# Patient Record
Sex: Female | Born: 1995 | Race: White | Hispanic: No | Marital: Single | Smoking: Current every day smoker
Health system: Southern US, Community
[De-identification: ages and names within clinical notes are randomized; demographics above are authoritative.]

## PROBLEM LIST (undated history)

## (undated) DIAGNOSIS — B2790 Infectious mononucleosis, unspecified without complication: Secondary | ICD-10-CM

## (undated) DIAGNOSIS — E669 Obesity, unspecified: Secondary | ICD-10-CM

## (undated) DIAGNOSIS — F99 Mental disorder, not otherwise specified: Secondary | ICD-10-CM

## (undated) DIAGNOSIS — J45909 Unspecified asthma, uncomplicated: Secondary | ICD-10-CM

## (undated) HISTORY — PX: NO PAST SURGERIES: SHX2092

## (undated) HISTORY — DX: Mental disorder, not otherwise specified: F99

---
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
## 2016-09-27 ENCOUNTER — Emergency Department (HOSPITAL_BASED_OUTPATIENT_CLINIC_OR_DEPARTMENT_OTHER): Payer: BLUE CROSS/BLUE SHIELD

## 2016-09-27 ENCOUNTER — Emergency Department (HOSPITAL_BASED_OUTPATIENT_CLINIC_OR_DEPARTMENT_OTHER)
Admission: EM | Admit: 2016-09-27 | Discharge: 2016-09-28 | Disposition: A | Discharge status: Home / Self Care | Payer: BLUE CROSS/BLUE SHIELD | Attending: Emergency Medicine | Admitting: Emergency Medicine

## 2016-09-27 ENCOUNTER — Encounter (HOSPITAL_BASED_OUTPATIENT_CLINIC_OR_DEPARTMENT_OTHER): Payer: Self-pay

## 2016-09-27 DIAGNOSIS — N12 Tubulo-interstitial nephritis, not specified as acute or chronic: Secondary | ICD-10-CM | POA: Insufficient documentation

## 2016-09-27 DIAGNOSIS — J45909 Unspecified asthma, uncomplicated: Secondary | ICD-10-CM | POA: Insufficient documentation

## 2016-09-27 DIAGNOSIS — J209 Acute bronchitis, unspecified: Secondary | ICD-10-CM | POA: Insufficient documentation

## 2016-09-27 DIAGNOSIS — R109 Unspecified abdominal pain: Secondary | ICD-10-CM | POA: Diagnosis present

## 2016-09-27 LAB — URINALYSIS, ROUTINE W REFLEX MICROSCOPIC
BILIRUBIN URINE: NEGATIVE
GLUCOSE, UA: NEGATIVE mg/dL
Ketones, ur: NEGATIVE mg/dL
Nitrite: NEGATIVE
SPECIFIC GRAVITY, URINE: 1.019 (ref 1.005–1.030)
pH: 6.5 (ref 5.0–8.0)

## 2016-09-27 LAB — CBC WITH DIFFERENTIAL/PLATELET
BASOS ABS: 0 10*3/uL (ref 0.0–0.1)
BASOS PCT: 0 %
Eosinophils Absolute: 0.6 10*3/uL (ref 0.0–0.7)
Eosinophils Relative: 5 %
HEMATOCRIT: 34.3 % — AB (ref 36.0–46.0)
HEMOGLOBIN: 11 g/dL — AB (ref 12.0–15.0)
LYMPHS PCT: 29 %
Lymphs Abs: 3.4 10*3/uL (ref 0.7–4.0)
MCH: 24.7 pg — ABNORMAL LOW (ref 26.0–34.0)
MCHC: 32.1 g/dL (ref 30.0–36.0)
MCV: 76.9 fL — AB (ref 78.0–100.0)
Monocytes Absolute: 0.9 10*3/uL (ref 0.1–1.0)
Monocytes Relative: 8 %
NEUTROS ABS: 6.7 10*3/uL (ref 1.7–7.7)
NEUTROS PCT: 58 %
Platelets: 294 10*3/uL (ref 150–400)
RBC: 4.46 MIL/uL (ref 3.87–5.11)
RDW: 15 % (ref 11.5–15.5)
WBC: 11.6 10*3/uL — ABNORMAL HIGH (ref 4.0–10.5)

## 2016-09-27 LAB — BASIC METABOLIC PANEL
Anion gap: 7 (ref 5–15)
BUN: 9 mg/dL (ref 6–20)
CALCIUM: 8.7 mg/dL — AB (ref 8.9–10.3)
CO2: 27 mmol/L (ref 22–32)
CREATININE: 0.73 mg/dL (ref 0.44–1.00)
Chloride: 105 mmol/L (ref 101–111)
GFR calc Af Amer: >60 mL/min (ref >60)
GLUCOSE: 97 mg/dL (ref 65–99)
Potassium: 3.5 mmol/L (ref 3.5–5.1)
Sodium: 139 mmol/L (ref 135–145)

## 2016-09-27 LAB — URINE MICROSCOPIC-ADD ON

## 2016-09-27 LAB — PREGNANCY, URINE: Preg Test, Ur: NEGATIVE

## 2016-09-27 MED ORDER — SODIUM CHLORIDE 0.9 % IV SOLN
Freq: Once | INTRAVENOUS | Status: AC
  Administered 2016-09-27: via INTRAVENOUS

## 2016-09-27 MED ORDER — FENTANYL CITRATE (PF) 100 MCG/2ML IJ SOLN
50.0000 ug | Freq: Once | INTRAMUSCULAR | Status: AC
  Administered 2016-09-27: 50 ug via INTRAVENOUS
  Filled 2016-09-27: qty 2

## 2016-09-27 MED ORDER — ONDANSETRON HCL 4 MG/2ML IJ SOLN
4.0000 mg | Freq: Once | INTRAMUSCULAR | Status: AC
  Administered 2016-09-27: 4 mg via INTRAVENOUS
  Filled 2016-09-27: qty 2

## 2016-09-27 NOTE — ED Notes (Addendum)
Patient transported to CT 

## 2016-09-27 NOTE — ED Provider Notes (Signed)
By signing my name below, I, Phillis HaggisGabriella Gaje, attest that this documentation has been prepared under the direction and in the presence of Paula LibraJohn Almina Schul, MD. Electronically Signed: Phillis HaggisGabriella Gaje, ED Scribe. 09/27/16. 11:05 PM.  [facility] Provider Note: Lowella DellJ. Lane Tarell Schollmeyer, MD, FACEP  CSN: [id number] MRN: [id number] ARRIVAL: 09/27/16 at 2130 ROOM: MH12/MH12   CHIEF COMPLAINT  Flank Pain  HISTORY OF PRESENT ILLNESS  HPI Comments: Haley Shaw is a 20 y.o. Female with a hx of medullary sponge kidney who presents to the Emergency Department complaining of sudden onset, sharp, non-radiating right flank pain onset 2 hours ago. Pt's pain worsens with movement. She currently rates her pain 6/10. Pt notes that she has been experiencing productive coughing and low grade fevers over the past three days. Pt denies chills, dysuria, hematuria, vaginal bleeding, or vaginal discharge.   Past Medical History:  Diagnosis Date  . Asthma   . Mental disorder   . Mononucleosis syndrome   . Obesity     Past Surgical History:  Procedure Laterality Date  . NO PAST SURGERIES      Family History  Problem Relation Age of Onset  . Drug abuse Father   . Bipolar disorder Mother   . Drug abuse Mother   . Drug abuse Brother     Social History  Substance Use Topics  . Smoking status: Never Smoker  . Smokeless tobacco: Never Used  . Alcohol use No     Comment: drinks monhtly 3 beers    Prior to Admission medications   Not on File   Allergies Tylenol [acetaminophen]  REVIEW OF SYSTEMS  Negative except as noted here or in the History of Present Illness.  PHYSICAL EXAMINATION  Initial Vital Signs Blood pressure 162/77, pulse 112, temperature 98.3 F (36.8 C), temperature source Oral, resp. rate 18, height 5\' 5"  (1.651 m), weight 240 lb (108.9 kg), last menstrual period 09/02/2016, SpO2 96 %.  Examination General: Well-developed, well-nourished female in no acute distress; appearance  consistent with age of record HENT: normocephalic; atraumatic Eyes: pupils equal, round and reactive to light; extraocular muscles intact Neck: supple Heart: regular rate and rhythm Lungs: clear to auscultation bilaterally; frequent cough Abdomen: soft; nondistended; nontender; no masses or hepatosplenomegaly; bowel sounds present GU: right CVA tenderness Extremities: No deformity; full range of motion; pulses normal Neurologic: Awake, alert and oriented; motor function intact in all extremities and symmetric; no facial droop Skin: Warm and dry Psychiatric: Normal mood and affect   RESULTS  Summary of this visit's results, reviewed by myself:   EKG Interpretation  Date/Time:    Ventricular Rate:    PR Interval:    QRS Duration:   QT Interval:    QTC Calculation:   R Axis:     Text Interpretation:        Laboratory Studies: Results for orders placed or performed during the hospital encounter of 09/27/16 (from the past 24 hour(s))  Urinalysis, Routine w reflex microscopic ([facility])     Status: Abnormal   Collection Time: 09/27/16  9:40 PM  Result Value Ref Range   Color, Urine YELLOW YELLOW   APPearance CLOUDY (A) CLEAR   Specific Gravity, Urine 1.019 1.005 - 1.030   pH 6.5 5.0 - 8.0   Glucose, UA NEGATIVE NEGATIVE mg/dL   Hgb urine dipstick LARGE (A) NEGATIVE   Bilirubin Urine NEGATIVE NEGATIVE   Ketones, ur NEGATIVE NEGATIVE mg/dL   Protein, ur >782>300 (A) NEGATIVE mg/dL   Nitrite  NEGATIVE NEGATIVE   Leukocytes, UA MODERATE (A) NEGATIVE  Pregnancy, urine     Status: None   Collection Time: 09/27/16  9:40 PM  Result Value Ref Range   Preg Test, Ur NEGATIVE NEGATIVE  Urine microscopic-add on     Status: Abnormal   Collection Time: 09/27/16  9:40 PM  Result Value Ref Range   Squamous Epithelial / LPF 6-30 (A) NONE SEEN   WBC, UA 6-30 0 - 5 WBC/hpf   RBC / HPF TOO NUMEROUS TO COUNT 0 - 5 RBC/hpf   Bacteria, UA MANY (A) NONE SEEN  CBC with Differential/Platelet      Status: Abnormal   Collection Time: 09/27/16 11:31 PM  Result Value Ref Range   WBC 11.6 (H) 4.0 - 10.5 K/uL   RBC 4.46 3.87 - 5.11 MIL/uL   Hemoglobin 11.0 (L) 12.0 - 15.0 g/dL   HCT 40.9 (L) 81.1 - 91.4 %   MCV 76.9 (L) 78.0 - 100.0 fL   MCH 24.7 (L) 26.0 - 34.0 pg   MCHC 32.1 30.0 - 36.0 g/dL   RDW 78.2 95.6 - 21.3 %   Platelets 294 150 - 400 K/uL   Neutrophils Relative % 58 %   Neutro Abs 6.7 1.7 - 7.7 K/uL   Lymphocytes Relative 29 %   Lymphs Abs 3.4 0.7 - 4.0 K/uL   Monocytes Relative 8 %   Monocytes Absolute 0.9 0.1 - 1.0 K/uL   Eosinophils Relative 5 %   Eosinophils Absolute 0.6 0.0 - 0.7 K/uL   Basophils Relative 0 %   Basophils Absolute 0.0 0.0 - 0.1 K/uL  Basic metabolic panel     Status: Abnormal   Collection Time: 09/27/16 11:31 PM  Result Value Ref Range   Sodium 139 135 - 145 mmol/L   Potassium 3.5 3.5 - 5.1 mmol/L   Chloride 105 101 - 111 mmol/L   CO2 27 22 - 32 mmol/L   Glucose, Bld 97 65 - 99 mg/dL   BUN 9 6 - 20 mg/dL   Creatinine, Ser 0.86 0.44 - 1.00 mg/dL   Calcium 8.7 (L) 8.9 - 10.3 mg/dL   GFR calc non Af Amer >60 >60 mL/min   GFR calc Af Amer >60 >60 mL/min   Anion gap 7 5 - 15   Imaging Studies: Dg Chest 2 View  Result Date: 09/27/2016 CLINICAL DATA:  Productive cough and congestion for 3 days with intermittent fever. EXAM: CHEST  2 VIEW COMPARISON:  10/19/2015 FINDINGS: The lungs are clear. The pulmonary vasculature is normal. Heart size is normal. Hilar and mediastinal contours are unremarkable. There is no pleural effusion. IMPRESSION: No active cardiopulmonary disease. Electronically Signed   By: Ellery Plunk M.D.   On: 09/27/2016 23:31   Ct Renal Stone Study  Result Date: 09/27/2016 CLINICAL DATA:  Worsening right flank pain for 4 hours. EXAM: CT ABDOMEN AND PELVIS WITHOUT CONTRAST TECHNIQUE: Multidetector CT imaging of the abdomen and pelvis was performed following the standard protocol without IV contrast. COMPARISON:  None.  FINDINGS: Lower chest: 4 mm noncalcified nodule at the lateral periphery of the left lower lobe. Hepatobiliary: No focal liver abnormality is seen. No gallstones, gallbladder wall thickening, or biliary dilatation. Pancreas: Unremarkable. No pancreatic ductal dilatation or surrounding inflammatory changes. Spleen: Normal in size without focal abnormality. Adrenals/Urinary Tract: Adrenal glands are unremarkable. Kidneys are normal, without renal calculi, focal lesion, or hydronephrosis. Bladder is unremarkable. Stomach/Bowel: Stomach is within normal limits. Appendix appears normal. No evidence of bowel wall  thickening, distention, or inflammatory changes. Vascular/Lymphatic: No significant vascular findings are present. No enlarged abdominal or pelvic lymph nodes. Reproductive: Uterus and bilateral adnexa are unremarkable. Other: No acute inflammatory changes in the abdomen or pelvis. No ascites. Musculoskeletal: No acute or significant osseous findings. IMPRESSION: No urinary calculi.  Normal appendix.  No acute findings. The left lung base pulmonary nodule is almost certainly benign. No follow-up needed if patient is low-risk. Electronically Signed   By: Ellery Plunkaniel R Mitchell M.D.   On: 09/27/2016 23:43    ED COURSE  Nursing notes and initial vitals signs, including pulse oximetry, reviewed.  Vitals:   09/27/16 2138 09/27/16 2338  BP: 162/77 129/68  Pulse: 112 92  Resp: 18 20  Temp: 98.3 F (36.8 C)   TempSrc: Oral   SpO2: 96% 100%  Weight: 240 lb (108.9 kg)   Height: 5\' 5"  (1.651 m)    We'll treat for pyelonephritis given flank pain and abnormal urinalysis. We will also treat for bronchitis.  PROCEDURES    ED DIAGNOSES     ICD-9-CM ICD-10-CM   1. Pyelonephritis 590.80 N12   2. Acute bronchitis with bronchospasm 466.0 J20.9     I personally performed the services described in this documentation, which was scribed in my presence. The recorded information has been reviewed and is  accurate.    Paula LibraJohn Tabytha Gradillas, MD 09/28/16 [phone]

## 2016-09-27 NOTE — ED Triage Notes (Signed)
Pt states laying in the bed started having rt lower back/flank pain x2hrs, getting worse. Denies urinary difficulties

## 2016-09-27 NOTE — ED Notes (Signed)
ED Provider at bedside. 

## 2016-09-28 MED ORDER — ALBUTEROL SULFATE HFA 108 (90 BASE) MCG/ACT IN AERS
2.0000 | INHALATION_SPRAY | RESPIRATORY_TRACT | Status: DC | PRN
  Administered 2016-09-28: 2 via RESPIRATORY_TRACT
  Filled 2016-09-28: qty 6.7

## 2016-09-28 MED ORDER — CIPROFLOXACIN HCL 500 MG PO TABS
500.0000 mg | ORAL_TABLET | Freq: Once | ORAL | Status: AC
  Administered 2016-09-28: 500 mg via ORAL
  Filled 2016-09-28: qty 1

## 2016-09-28 MED ORDER — CIPROFLOXACIN HCL 500 MG PO TABS: 500.0000 mg | ORAL_TABLET | Freq: Two times a day (BID) | ORAL | 0 refills | Status: AC

## 2016-09-28 NOTE — ED Notes (Signed)
ED Provider at bedside discussing test results and discharge plan of care.

## 2016-09-29 LAB — URINE CULTURE

## 2017-08-24 ENCOUNTER — Inpatient Hospital Stay (HOSPITAL_COMMUNITY)
Admission: AD | Admit: 2017-08-24 | Discharge: 2017-08-24 | Disposition: A | Discharge status: Home / Self Care | Payer: BLUE CROSS/BLUE SHIELD | Source: Ambulatory Visit | Attending: Family Medicine | Admitting: Family Medicine

## 2017-08-24 ENCOUNTER — Encounter (HOSPITAL_COMMUNITY): Payer: Self-pay

## 2017-08-24 DIAGNOSIS — E669 Obesity, unspecified: Secondary | ICD-10-CM | POA: Diagnosis not present

## 2017-08-24 DIAGNOSIS — R112 Nausea with vomiting, unspecified: Secondary | ICD-10-CM | POA: Insufficient documentation

## 2017-08-24 DIAGNOSIS — R109 Unspecified abdominal pain: Secondary | ICD-10-CM | POA: Insufficient documentation

## 2017-08-24 DIAGNOSIS — N390 Urinary tract infection, site not specified: Secondary | ICD-10-CM | POA: Insufficient documentation

## 2017-08-24 DIAGNOSIS — R82998 Other abnormal findings in urine: Secondary | ICD-10-CM

## 2017-08-24 DIAGNOSIS — J45909 Unspecified asthma, uncomplicated: Secondary | ICD-10-CM | POA: Insufficient documentation

## 2017-08-24 DIAGNOSIS — R197 Diarrhea, unspecified: Secondary | ICD-10-CM | POA: Insufficient documentation

## 2017-08-24 DIAGNOSIS — F17210 Nicotine dependence, cigarettes, uncomplicated: Secondary | ICD-10-CM | POA: Insufficient documentation

## 2017-08-24 LAB — URINALYSIS, ROUTINE W REFLEX MICROSCOPIC
BILIRUBIN URINE: NEGATIVE
Glucose, UA: NEGATIVE mg/dL
Ketones, ur: NEGATIVE mg/dL
NITRITE: NEGATIVE
PROTEIN: 100 mg/dL — AB
Specific Gravity, Urine: 1.017 (ref 1.005–1.030)
pH: 5 (ref 5.0–8.0)

## 2017-08-24 LAB — BASIC METABOLIC PANEL
Anion gap: 9 (ref 5–15)
BUN: 10 mg/dL (ref 6–20)
CHLORIDE: 105 mmol/L (ref 101–111)
CO2: 26 mmol/L (ref 22–32)
CREATININE: 0.74 mg/dL (ref 0.44–1.00)
Calcium: 9.2 mg/dL (ref 8.9–10.3)
GFR calc Af Amer: >60 mL/min (ref >60)
GFR calc non Af Amer: >60 mL/min (ref >60)
Glucose, Bld: 96 mg/dL (ref 65–99)
Potassium: 3.8 mmol/L (ref 3.5–5.1)
SODIUM: 140 mmol/L (ref 135–145)

## 2017-08-24 LAB — CBC
HCT: 35.3 % — ABNORMAL LOW (ref 36.0–46.0)
HEMOGLOBIN: 11.7 g/dL — AB (ref 12.0–15.0)
MCH: 27.4 pg (ref 26.0–34.0)
MCHC: 33.1 g/dL (ref 30.0–36.0)
MCV: 82.7 fL (ref 78.0–100.0)
Platelets: 296 10*3/uL (ref 150–400)
RBC: 4.27 MIL/uL (ref 3.87–5.11)
RDW: 13.9 % (ref 11.5–15.5)
WBC: 11.6 10*3/uL — ABNORMAL HIGH (ref 4.0–10.5)

## 2017-08-24 LAB — POCT PREGNANCY, URINE: PREG TEST UR: NEGATIVE

## 2017-08-24 MED ORDER — SULFAMETHOXAZOLE-TRIMETHOPRIM 800-160 MG PO TABS: 1.0000 | ORAL_TABLET | Freq: Two times a day (BID) | ORAL | 0 refills | Status: AC

## 2017-08-24 MED ORDER — PROMETHAZINE HCL 25 MG PO TABS: 25.0000 mg | ORAL_TABLET | Freq: Four times a day (QID) | ORAL | 2 refills | Status: AC | PRN

## 2017-08-24 NOTE — Discharge Instructions (Signed)
Chronic Diarrhea Diarrhea is a condition in which a person passes frequent loose and watery stools. It can cause you to feel weak and dehydrated. Dehydration can make you tired and thirsty. It can also cause a dry mouth, decreased urination, and dark yellow urine. Diarrhea is a sign of another underlying problem, such as:  Infection.  Medication side effects.  Dietary intolerance, such as lactose intolerance.  Conditions such as celiac disease, irritable bowel syndrome (IBS), or inflammatory bowel disease (IBD).  In most cases, diarrhea lasts 2-3 days. Diarrhea that lasts longer than 4 weeks is called long-lasting (chronic) diarrhea. It is important that you treat your diarrhea as told by your health care provider. Follow these instructions at home: Follow these recommendations as told by your health care provider. Eating and drinking  Take an oral rehydration solution (ORS). This is a drink that is designed to keep you hydrated. It can be found at pharmacies and retail stores.  Drink clear fluids, such as water, ice chips, diluted fruit juice, and low-calorie sports drinks.  Follow the diet recommended by your health care provider. You may need to avoid foods that trigger diarrhea for you.  Avoid foods and beverages that contain a lot of sugar or caffeine.  Avoid alcohol.  Avoid spicy or fatty foods. General instructions  Drink enough fluid to keep your urine clear or pale yellow.  Wash your hands often and after each diarrhea episode. If soap and water are not available, use hand sanitizer.  Make sure that all people in your household wash their hands well and often.  Take over-the-counter and prescription medicines only as told by your health care provider.  If you were prescribed an antibiotic medicine, take it as told by your health care provider. Do not stop taking the antibiotic even if you start to feel better.  Rest at home while you recover.  Watch your condition  for any changes.  Take a warm bath to relieve any burning or pain from frequent diarrhea episodes.  Keep all follow-up visits as told by your health care provider. This is important. Contact a health care provider if:  You have a fever.  Your diarrhea gets worse or does not get better.  You have new symptoms.  You cannot drink fluids without vomiting.  You feel light-headed or dizzy.  You have a headache.  You have muscle cramps.  You have severe pain in the rectum. Get help right away if:  You have persistent vomiting.  You have chest pain.  You feel extremely weak or you faint.  You have bloody or black stools, or stools that look like tar.  You have severe pain, cramping, or bloating in your abdomen, or pain that stays in one place.  You have trouble breathing or you are breathing very quickly.  Your heart is beating very quickly.  Your skin feels cold and clammy.  You feel confused.  You have a severe headache.  You have signs of dehydration, such as: ? Dark urine, very little urine, or no urine. ? Cracked lips. ? Dry mouth. ? Sunken eyes. ? Sleepiness. ? Weakness. Summary  Chronic diarrhea is a condition in which a person passes frequent loose and watery stools for more than 4 weeks.  Diarrhea is a sign of another underlying problem.  Drink enough fluid to keep your urine clear or pale yellow to avoid dehydration.  Wash your hands often and after each diarrhea episode. If soap and water are not available, use  hand sanitizer.  It is important that you treat your diarrhea as told by your health care provider. This information is not intended to replace advice given to you by your health care provider. Make sure you discuss any questions you have with your health care provider. Document Released: 01/21/2004 Document Revised: 09/19/2016 Document Reviewed: 09/19/2016 Elsevier Interactive Patient Education  2017 [employer]  Urinary Tract Infection,  Adult A urinary tract infection (UTI) is an infection of any part of the urinary tract, which includes the kidneys, ureters, bladder, and urethra. These organs make, store, and get rid of urine in the body. UTI can be a bladder infection (cystitis) or kidney infection (pyelonephritis). What are the causes? This infection may be caused by fungi, viruses, or bacteria. Bacteria are the most common cause of UTIs. This condition can also be caused by repeated incomplete emptying of the bladder during urination. What increases the risk? This condition is more likely to develop if:  You ignore your need to urinate or hold urine for long periods of time.  You do not empty your bladder completely during urination.  You wipe back to front after urinating or having a bowel movement, if you are female.  You are uncircumcised, if you are female.  You are constipated.  You have a urinary catheter that stays in place (indwelling).  You have a weak defense (immune) system.  You have a medical condition that affects your bowels, kidneys, or bladder.  You have diabetes.  You take antibiotic medicines frequently or for long periods of time, and the antibiotics no longer work well against certain types of infections (antibiotic resistance).  You take medicines that irritate your urinary tract.  You are exposed to chemicals that irritate your urinary tract.  You are female.  What are the signs or symptoms? Symptoms of this condition include:  Fever.  Frequent urination or passing small amounts of urine frequently.  Needing to urinate urgently.  Pain or burning with urination.  Urine that smells bad or unusual.  Cloudy urine.  Pain in the lower abdomen or back.  Trouble urinating.  Blood in the urine.  Vomiting or being less hungry than normal.  Diarrhea or abdominal pain.  Vaginal discharge, if you are female.  How is this diagnosed? This condition is diagnosed with a medical  history and physical exam. You will also need to provide a urine sample to test your urine. Other tests may be done, including:  Blood tests.  Sexually transmitted disease (STD) testing.  If you have had more than one UTI, a cystoscopy or imaging studies may be done to determine the cause of the infections. How is this treated? Treatment for this condition often includes a combination of two or more of the following:  Antibiotic medicine.  Other medicines to treat less common causes of UTI.  Over-the-counter medicines to treat pain.  Drinking enough water to stay hydrated.  Follow these instructions at home:  Take over-the-counter and prescription medicines only as told by your health care provider.  If you were prescribed an antibiotic, take it as told by your health care provider. Do not stop taking the antibiotic even if you start to feel better.  Avoid alcohol, caffeine, tea, and carbonated beverages. They can irritate your bladder.  Drink enough fluid to keep your urine clear or pale yellow.  Keep all follow-up visits as told by your health care provider. This is important.  Make sure to: ? Empty your bladder often and completely.  Do not hold urine for long periods of time. ? Empty your bladder before and after sex. ? Wipe from front to back after a bowel movement if you are female. Use each tissue one time when you wipe. Contact a health care provider if:  You have back pain.  You have a fever.  You feel nauseous or vomit.  Your symptoms do not get better after 3 days.  Your symptoms go away and then return. Get help right away if:  You have severe back pain or lower abdominal pain.  You are vomiting and cannot keep down any medicines or water. This information is not intended to replace advice given to you by your health care provider. Make sure you discuss any questions you have with your health care provider. Document Released: 08/10/2005 Document Revised:  04/13/2016 Document Reviewed: 09/21/2015 Elsevier Interactive Patient Education  2017 [employer].

## 2017-08-24 NOTE — MAU Note (Signed)
Pt presents to MAU with c/o of abdominal pain that started tonight. Pt has had diarrhea for 2 weeks and nausea/vomiting for 2 days. Pt denies fever. LMP-08/03/2017.

## 2017-08-24 NOTE — MAU Provider Note (Signed)
Chief Complaint:  Abdominal Pain   First Provider Initiated Contact with Patient 08/24/17 2205      HPI: Haley Shaw is a 21 y.o. who presents to maternity admissions reporting diarrhea for 2 weeks, 5 per day.  Also had had nausea and vomiting for 2 days.  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 Has not seen  Her primary doctor or any other provider for this.   Denies fever or chills with these episodes. .When asked what changed to bring her in tonight. States just started having more pain in her abdomen. Points to lower abdomen but states it is all over.  She reports vaginal bleeding, vaginal itching/burning, urinary symptoms, h/a, dizziness, or fever/chills.  Does admit to having intermittent diarrhea over the past months/years, maybe once a week.  Has never been evaluated for that.   Denies any gynecological complaints.   Abdominal Pain  This is a recurrent problem. The current episode started 1 to 4 weeks ago. The onset quality is gradual. The problem occurs intermittently. The problem has been gradually worsening. The pain is located in the generalized abdominal region. The pain is moderate. The quality of the pain is aching and cramping. The abdominal pain does not radiate. Associated symptoms include diarrhea, nausea, vomiting and weight loss (cannot quantify how much or for how long). Pertinent negatives include no anorexia (states is able to eat and drink, despite reporting nausea and vomiting), constipation, dysuria, fever, headaches, melena or myalgias. Nothing aggravates the pain. The pain is relieved by nothing. She has tried nothing for the symptoms.    RN note: Pt presents to MAU with c/o of abdominal pain that started tonight. Pt has had diarrhea for 2 weeks and nausea/vomiting for 2 days. Pt denies fever. LMP-08/03/2017.  Past Medical History: Past Medical History:  Diagnosis Date  . Asthma   . Mental disorder   . Mononucleosis syndrome   . Obesity      Past obstetric history: OB History  No data available    Past Surgical History: Past Surgical History:  Procedure Laterality Date  . NO PAST SURGERIES      Family History: Family History  Problem Relation Age of Onset  . Drug abuse Father   . Bipolar disorder Mother   . Drug abuse Mother   . Drug abuse Brother     Social History: Social History  Substance Use Topics  . Smoking status: Current Every Day Smoker    Packs/day: 1.00    Types: Cigarettes  . Smokeless tobacco: Never Used  . Alcohol use Yes     Comment: occ    Allergies:  Allergies  Allergen Reactions  . Tylenol [Acetaminophen] Other (See Comments)    States overdosed on tylenol 38yrs ago and was told do not take anymore    Meds:  Prescriptions Prior to Admission  Medication Sig Dispense Refill Last Dose  . ciprofloxacin (CIPRO) 500 MG tablet Take 1 tablet (500 mg total) by mouth 2 (two) times daily. One po bid x 7 days 14 tablet 0 More than a month at Unknown time    I have reviewed patient's Past Medical Hx, Surgical Hx, Family Hx, Social Hx, medications and allergies.  ROS:  Review of Systems  Constitutional: Positive for weight loss (cannot quantify how much or for how long). Negative for fever.  Gastrointestinal: Positive for abdominal pain, diarrhea, nausea and vomiting. Negative for anorexia (states is able to eat and drink, despite reporting nausea and vomiting),  constipation and melena.  Genitourinary: Negative for dysuria.  Musculoskeletal: Negative for myalgias.  Neurological: Negative for headaches.   Other systems negative     Physical Exam  Patient Vitals for the past 24 hrs:  BP Temp Temp src Pulse Resp  08/24/17 2124 137/79 98.3 F (36.8 C) Oral 80 18   Constitutional: Well-developed, well-nourished female in no acute distress.  Cardiovascular: normal rate and rhythm, no ectopy audible, S1 & S2 heard, no murmur Respiratory: normal effort, no distress. Lungs CTAB with no  wheezes or crackles GI: Abd soft, mildly to moderately tender with deep palpation, but .  Nondistended and  No rebound, No guarding.  Bowel Sounds audible  MS: Extremities nontender, no edema, normal ROM Neurologic: Alert and oriented x 4.   Grossly nonfocal. GU: Neg CVAT. Skin:  Warm and Dry Psych:  Affect appropriate.  PELVIC EXAM: Deferred, Denies gynecologic complaint   Labs:    Results for orders placed or performed during the hospital encounter of 08/24/17 (from the past 24 hour(s))  Urinalysis, Routine w reflex microscopic     Status: Abnormal   Collection Time: 08/24/17  9:15 PM  Result Value Ref Range   Color, Urine YELLOW YELLOW   APPearance HAZY (A) CLEAR   Specific Gravity, Urine 1.017 1.005 - 1.030   pH 5.0 5.0 - 8.0   Glucose, UA NEGATIVE NEGATIVE mg/dL   Hgb urine dipstick LARGE (A) NEGATIVE   Bilirubin Urine NEGATIVE NEGATIVE   Ketones, ur NEGATIVE NEGATIVE mg/dL   Protein, ur 960 (A) NEGATIVE mg/dL   Nitrite NEGATIVE NEGATIVE   Leukocytes, UA LARGE (A) NEGATIVE   RBC / HPF TOO NUMEROUS TO COUNT 0 - 5 RBC/hpf   WBC, UA TOO NUMEROUS TO COUNT 0 - 5 WBC/hpf   Bacteria, UA FEW (A) NONE SEEN   Squamous Epithelial / LPF 6-30 (A) NONE SEEN   Mucus PRESENT   Pregnancy, urine POC     Status: None   Collection Time: 08/24/17  9:56 PM  Result Value Ref Range   Preg Test, Ur NEGATIVE NEGATIVE  CBC     Status: Abnormal   Collection Time: 08/24/17 10:30 PM  Result Value Ref Range   WBC 11.6 (H) 4.0 - 10.5 K/uL   RBC 4.27 3.87 - 5.11 MIL/uL   Hemoglobin 11.7 (L) 12.0 - 15.0 g/dL   HCT 45.4 (L) 09.8 - 11.9 %   MCV 82.7 78.0 - 100.0 fL   MCH 27.4 26.0 - 34.0 pg   MCHC 33.1 30.0 - 36.0 g/dL   RDW 14.7 82.9 - 56.2 %   Platelets 296 150 - 400 K/uL  Basic metabolic panel     Status: None   Collection Time: 08/24/17 10:30 PM  Result Value Ref Range   Sodium 140 135 - 145 mmol/L   Potassium 3.8 3.5 - 5.1 mmol/L   Chloride 105 101 - 111 mmol/L   CO2 26 22 - 32 mmol/L    Glucose, Bld 96 65 - 99 mg/dL   BUN 10 6 - 20 mg/dL   Creatinine, Ser 1.30 0.44 - 1.00 mg/dL   Calcium 9.2 8.9 - 86.5 mg/dL   GFR calc non Af Amer >60 >60 mL/min   GFR calc Af Amer >60 >60 mL/min   Anion gap 9 5 - 15     Imaging:  No results found.  MAU Course/MDM: I have ordered labs as follows:  CBC and BMET to rule out acute abdominal processes, Stool culture/O&P ordered Imaging ordered:  none Results reviewed. Normal labs except UA is suggestive of UTI. Sent to culture  Will treat  Will send home with stool culture supplies since she has not had a stool here.  Will recommend she turn it in to her doctor in [facility] and schedule a visit this week Pt stable at time of discharge.  Assessment: Diarrhea, unspecified type - Plan: Discharge patient  Nausea and vomiting, intractability of vomiting not specified, unspecified vomiting type - Plan: Discharge patient  Leukocytes in urine - Plan: Discharge patient   Plan: Discharge home Recommend Push po fluids, advance diet as tolerated Rx sent for Phenergan  for nausea Rx Septra DS for UTI   Encouraged to return here or to other Urgent Care/ED if she develops worsening of symptoms, increase in pain, fever, or other concerning symptoms.   Wynelle Bourgeois CNM, MSN Certified Nurse-Midwife 08/24/2017 10:06 PM

## 2017-08-25 IMAGING — CT CT RENAL STONE PROTOCOL
2 of 4 series · 17 of 46 positions shown, 19 images · non-contrast
Comparison: None.

CLINICAL DATA: Worsening right flank pain for 4 hours.

EXAM:
CT ABDOMEN AND PELVIS WITHOUT CONTRAST
TECHNIQUE: Multidetector CT imaging of the abdomen and pelvis was performed
following the standard protocol without IV contrast.

[Series 2: axial st · axial · 0.91mm/px · z∈[-461,-21]mm · 14 of 97 slices shown, 16 images]
[im 5/97  soft-tissue]
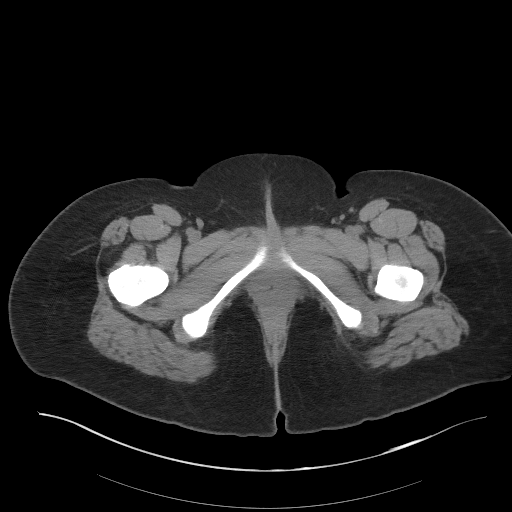
[im 5/97  bone]
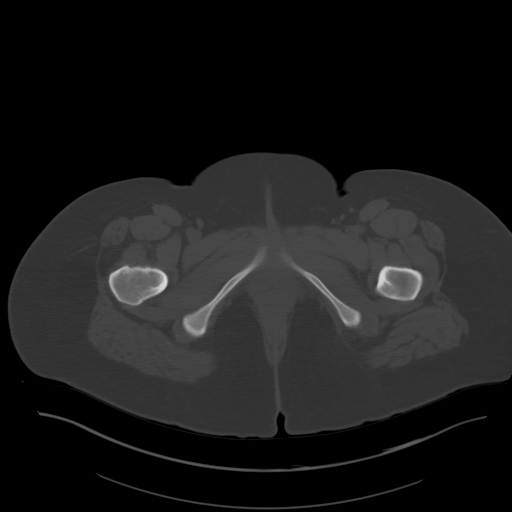
[im 13/97  soft-tissue]
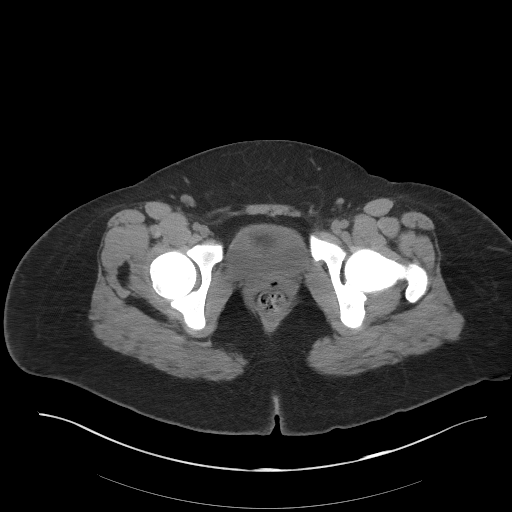
[im 21/97  soft-tissue]
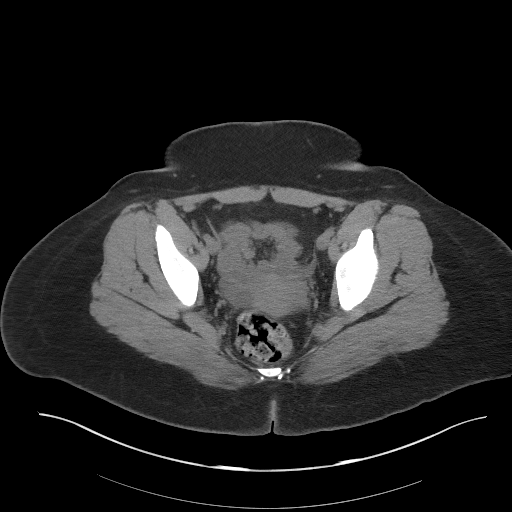
[im 25/97  soft-tissue]
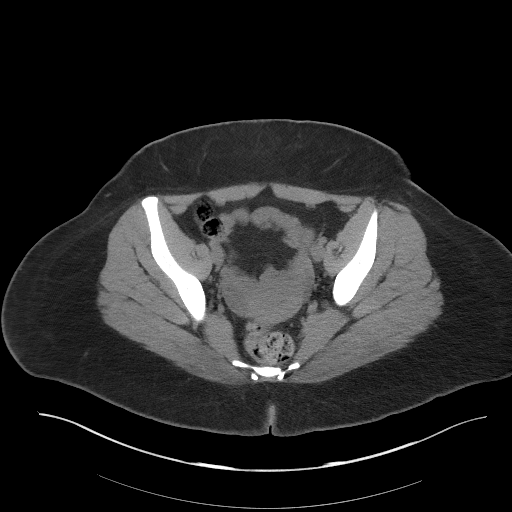
[im 33/97  soft-tissue]
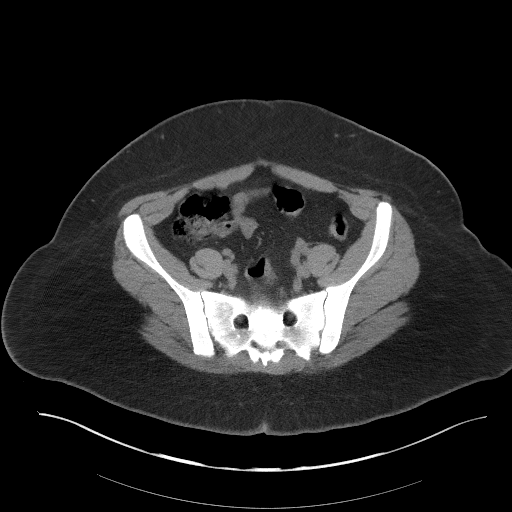
[im 41/97  soft-tissue]
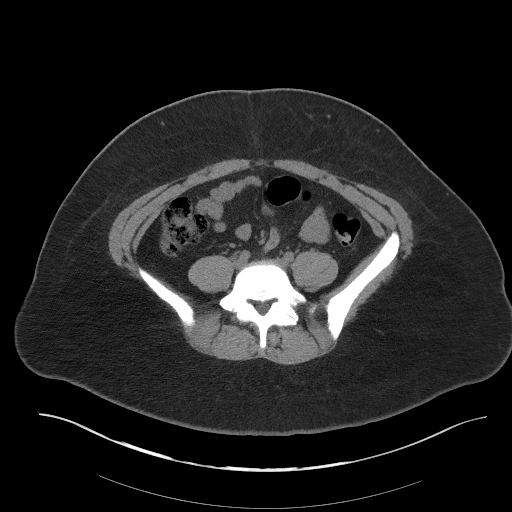
[im 45/97  soft-tissue]
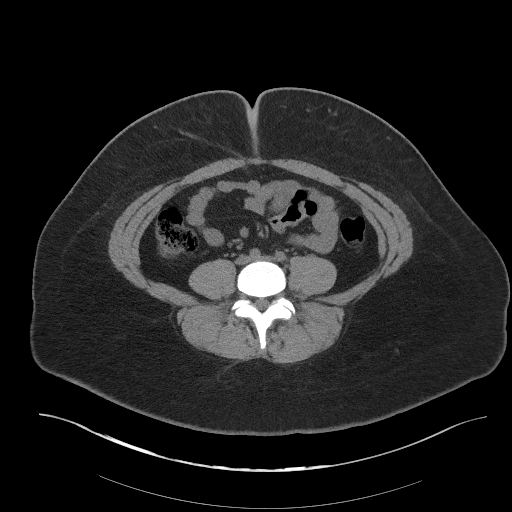
[im 53/97  soft-tissue]
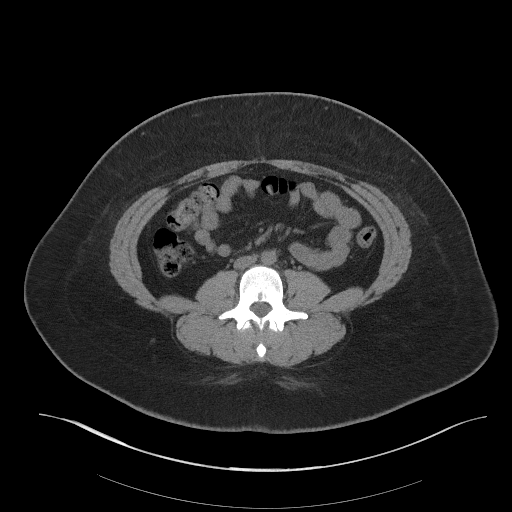
[im 57/97  soft-tissue]
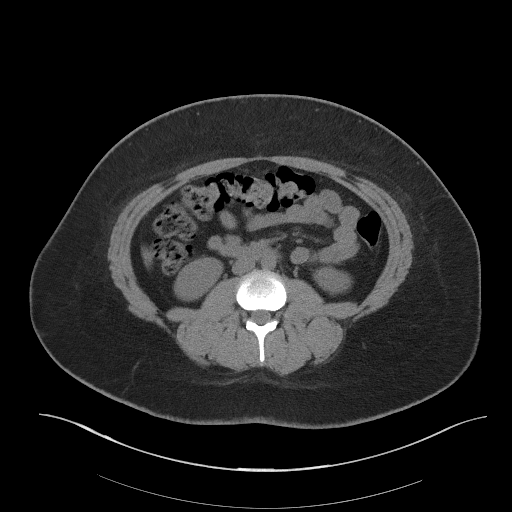
[im 57/97  bone]
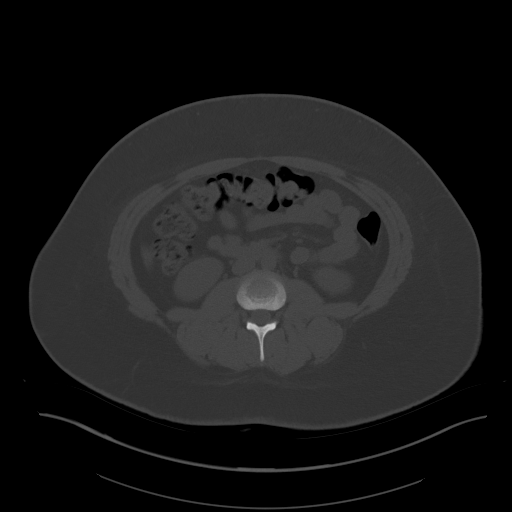
[im 65/97  soft-tissue]
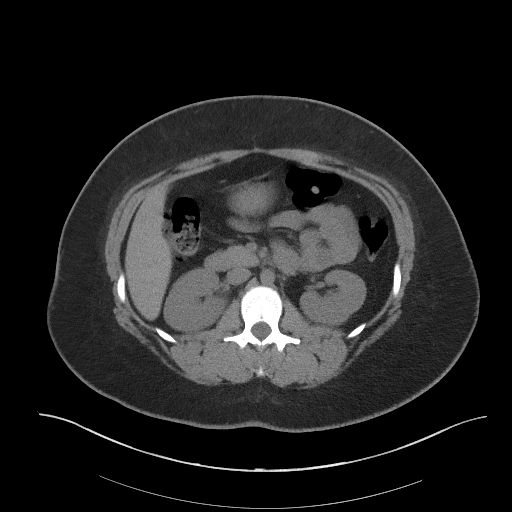
[im 73/97  soft-tissue]
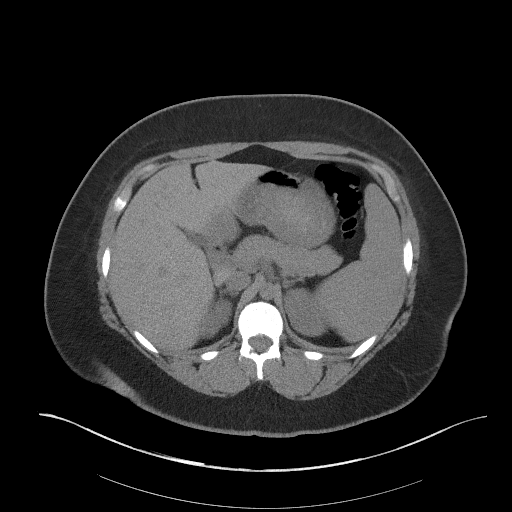
[im 77/97  soft-tissue]
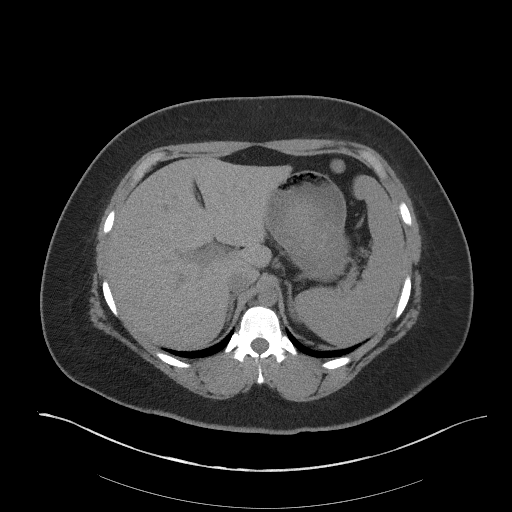
[im 85/97  soft-tissue]
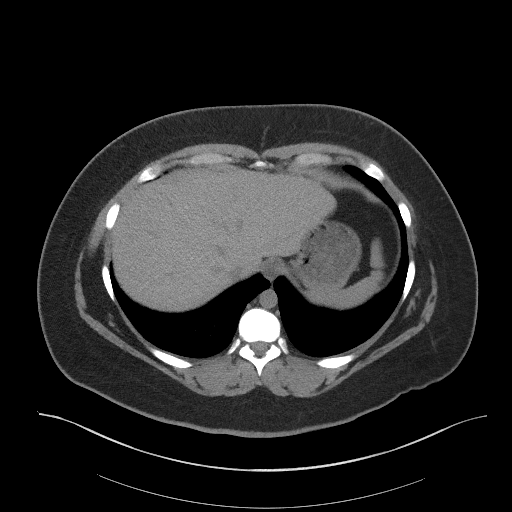
[im 93/97  soft-tissue]
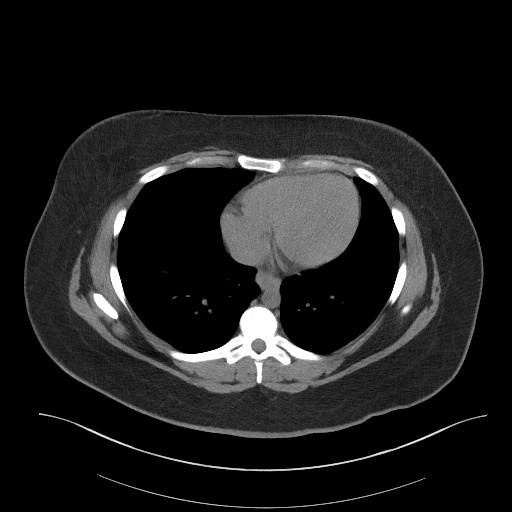

[Series 4: coronal st · coronal · 0.84mm/px · 3 of 69 slices shown]
[im 23/69  soft-tissue]
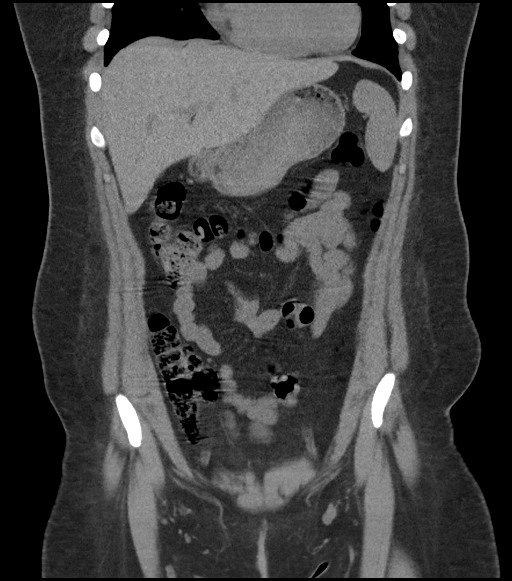
[im 31/69  soft-tissue]
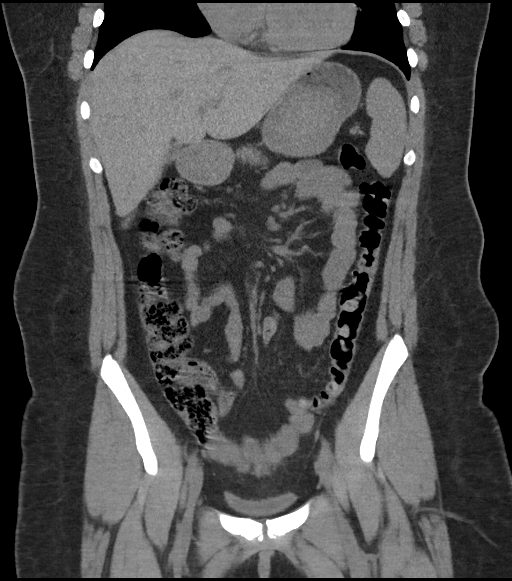
[im 38/69  soft-tissue]
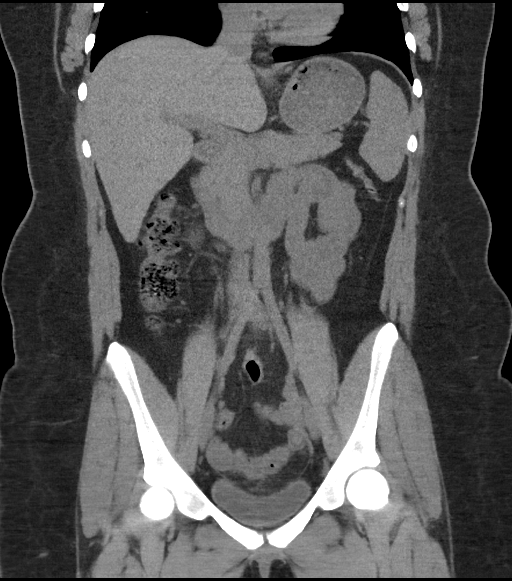

[17 of 46 positions shown; findings below may reference images not displayed]

FINDINGS: Lower chest: 4 mm noncalcified nodule at the lateral periphery of
the left lower lobe.

Hepatobiliary: No focal liver abnormality is seen. No gallstones,
gallbladder wall thickening, or biliary dilatation.

Pancreas: Unremarkable. No pancreatic ductal dilatation or
surrounding inflammatory changes.

Spleen: Normal in size without focal abnormality.

Adrenals/Urinary Tract: Adrenal glands are unremarkable. Kidneys are
normal, without renal calculi, focal lesion, or hydronephrosis.
Bladder is unremarkable.

Stomach/Bowel: Stomach is within normal limits. Appendix appears
normal. No evidence of bowel wall thickening, distention, or
inflammatory changes.

Vascular/Lymphatic: No significant vascular findings are present. No
enlarged abdominal or pelvic lymph nodes.

Reproductive: Uterus and bilateral adnexa are unremarkable.

Other: No acute inflammatory changes in the abdomen or pelvis. No
ascites.

Musculoskeletal: No acute or significant osseous findings.
IMPRESSION: No urinary calculi.  Normal appendix.  No acute findings.

The left lung base pulmonary nodule is almost certainly benign. No
follow-up needed if patient is low-risk.

## 2017-08-26 LAB — URINE CULTURE: Culture: >=100000 — AB
